# Patient Record
Sex: Female | Born: 1990 | Race: White | Hispanic: No | Marital: Single | Smoking: Never smoker
Health system: Southern US, Community
[De-identification: ages and names within clinical notes are randomized; demographics above are authoritative.]

## PROBLEM LIST (undated history)

## (undated) DIAGNOSIS — K589 Irritable bowel syndrome without diarrhea: Secondary | ICD-10-CM

## (undated) HISTORY — DX: Irritable bowel syndrome without diarrhea: K58.9

## (undated) HISTORY — PX: WISDOM TOOTH EXTRACTION: SHX21

---
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
## 2015-01-23 ENCOUNTER — Encounter: Payer: Self-pay | Admitting: Internal Medicine

## 2015-03-11 ENCOUNTER — Ambulatory Visit (INDEPENDENT_AMBULATORY_CARE_PROVIDER_SITE_OTHER): Payer: Federal, State, Local not specified - PPO | Admitting: Internal Medicine

## 2015-03-11 ENCOUNTER — Other Ambulatory Visit (INDEPENDENT_AMBULATORY_CARE_PROVIDER_SITE_OTHER): Payer: Federal, State, Local not specified - PPO

## 2015-03-11 ENCOUNTER — Encounter: Payer: Self-pay | Admitting: Internal Medicine

## 2015-03-11 VITALS — BP 110/70 | HR 80 | Ht 68.0 in | Wt 137.8 lb

## 2015-03-11 DIAGNOSIS — R1031 Right lower quadrant pain: Secondary | ICD-10-CM | POA: Diagnosis not present

## 2015-03-11 DIAGNOSIS — K5900 Constipation, unspecified: Secondary | ICD-10-CM | POA: Diagnosis not present

## 2015-03-11 LAB — CBC WITH DIFFERENTIAL/PLATELET
Basophils Absolute: 0 10*3/uL (ref 0.0–0.1)
Basophils Relative: 0.4 % (ref 0.0–3.0)
Eosinophils Absolute: 0 10*3/uL (ref 0.0–0.7)
Eosinophils Relative: 0.5 % (ref 0.0–5.0)
HCT: 43.3 % (ref 36.0–46.0)
Hemoglobin: 15 g/dL (ref 12.0–15.0)
LYMPHS ABS: 1.8 10*3/uL (ref 0.7–4.0)
LYMPHS PCT: 29.6 % (ref 12.0–46.0)
MCHC: 34.6 g/dL (ref 30.0–36.0)
MCV: 86.5 fl (ref 78.0–100.0)
MONOS PCT: 10.9 % (ref 3.0–12.0)
Monocytes Absolute: 0.7 10*3/uL (ref 0.1–1.0)
Neutro Abs: 3.5 10*3/uL (ref 1.4–7.7)
Neutrophils Relative %: 58.6 % (ref 43.0–77.0)
PLATELETS: 283 10*3/uL (ref 150.0–400.0)
RBC: 5 Mil/uL (ref 3.87–5.11)
RDW: 12.9 % (ref 11.5–15.5)
WBC: 6 10*3/uL (ref 4.0–10.5)

## 2015-03-11 LAB — COMPREHENSIVE METABOLIC PANEL
ALBUMIN: 4.6 g/dL (ref 3.5–5.2)
ALK PHOS: 54 U/L (ref 39–117)
ALT: 11 U/L (ref 0–35)
AST: 15 U/L (ref 0–37)
BUN: 13 mg/dL (ref 6–23)
CO2: 26 mEq/L (ref 19–32)
Calcium: 9.6 mg/dL (ref 8.4–10.5)
Chloride: 103 mEq/L (ref 96–112)
Creatinine, Ser: 0.73 mg/dL (ref 0.40–1.20)
GFR: 103.87 mL/min (ref >60.00)
Glucose, Bld: 99 mg/dL (ref 70–99)
POTASSIUM: 4.2 meq/L (ref 3.5–5.1)
SODIUM: 136 meq/L (ref 135–145)
Total Bilirubin: 0.6 mg/dL (ref 0.2–1.2)
Total Protein: 7.6 g/dL (ref 6.0–8.3)

## 2015-03-11 LAB — VITAMIN B12: Vitamin B-12: 167 pg/mL — ABNORMAL LOW (ref 211–911)

## 2015-03-11 LAB — TSH: TSH: 3.37 u[IU]/mL (ref 0.35–4.50)

## 2015-03-11 LAB — SEDIMENTATION RATE: SED RATE: 13 mm/h (ref 0–22)

## 2015-03-11 MED ORDER — LINACLOTIDE 145 MCG PO CAPS: 145.0000 ug | ORAL_CAPSULE | Freq: Every day | ORAL | Status: AC

## 2015-03-11 MED ORDER — HYOSCYAMINE SULFATE 0.125 MG SL SUBL: 0.1250 mg | SUBLINGUAL_TABLET | SUBLINGUAL | Status: AC | PRN

## 2015-03-11 NOTE — Progress Notes (Signed)
Kaitlyn Stewart [birth year] [id number]  Note: This dictation was prepared with Dragon digital system. Any transcriptional errors that result from this procedure are unintentional.   History of Present Illness: This is a 24 year old white female who comes with her mother to evaluate change in bowel habits. She was seen by pediatric gastroenterologist Dr. Chestine Spore about 10 years ago for diarrhea and urgency but no definite cause was found. Her sprue profile was negative and the diarrhea subsided. She is now complaining of mostly constipation and bloating. She'll wake up at night with crampy abdominal pain. She does not take any laxatives.and denies rectal bleeding. Her weight has recently increased 5 pounds since she moved in with her boyfriend. She reports  good eating habits. There is a positive family history of irritable bowel syndrome in her father and colon polyps in her mother. Patient has an office job. She goes to gym 3 times a week. Her current bowel habits are BM every 3 days. She has not taken any laxatives  except for a herbal tea .    No past medical history on file.  No past surgical history on file.  Allergies not on file  Family history and social history have been reviewed.  Review of Systems: Occasional rectal irritation. Lower abdominal pain. Predominant constipation with the breakthrough diarrhea  The remainder of the 10 point ROS is negative except as outlined in the H&P  Physical Exam: General Appearance Well developed, in no distress Eyes  Non icteric  HEENT  Non traumatic, normocephalic  Mouth No lesion, tongue papillated, no cheilosis Neck Supple without adenopathy, thyroid not enlarged, no carotid bruits, no JVD Lungs Clear to auscultation bilaterally COR Normal S1, normal S2, regular rhythm, no murmur, quiet precordium Abdomen soft mildly tender in the right lower and middle quadrants. No fullness. No rebound. Liver edge at costal margin. Left lower and upper  quadrants unremarkable Rectal normal rectal sphincter tone. Normal perianal area. Heart Hemoccult-negative stool Extremities  No pedal edema Skin No lesions Neurological Alert and oriented x 3 Psychological Normal mood and affect  Assessment and Plan:   24 year old white female with history of diarrhea attributed to irritable bowel syndrome by a pediatric gastroenterologist. She is now having predominant constipation and  breakthrough diarrhea occasionally. Otherwise she is eating well and has actually gained 5 pounds. There is no family history of inflammatory bowel disease. On her physical exam she has tenderness in RMQ a question due to  either constipation or possible Crohn's disease. Although irritable bowel syndrome is the most likely diagnosis  we will proceed with CT scan of the abdomen to visualize  terminal ileum and to rule out any organic disease. We will be also checking sedimentation rate, metabolic panel. TSH. Iron and B12. We will begin Linzess 145 g daily and Levsin sublingually 0.125 mg on when necessary basis for cramps. She will also begin Benefiber  1 heaping teaspoon every morning. I will see her in 8 weeks    Lina Sar 03/11/2015

## 2015-03-11 NOTE — Patient Instructions (Signed)
We have sent medications to your pharmacy for you to pick up at your convenience.  Your physician has requested that you go to the basement for lab work before leaving today.  You are schedule for a follow up with Dr Olevia Perches 04/29/2015 at 10:15am.  Please get Benefiber and take a heaping teaspoon daily.  You have been scheduled for a CT scan of the abdomen and pelvis at [place] are scheduled on 03/13/2015 at 930am. You should arrive 15 minutes prior to your appointment time for registration. Please follow the written instructions below on the day of your exam:  WARNING: IF YOU ARE ALLERGIC TO IODINE/X-RAY DYE, PLEASE NOTIFY RADIOLOGY IMMEDIATELY AT [phone]! YOU WILL BE GIVEN A 13 HOUR PREMEDICATION PREP.  1) Do not eat or drink anything after 530am (4 hours prior to your test) 2) You have been given 2 bottles of oral contrast to drink. The solution may taste better if refrigerated, but do NOT add ice or any other liquid to this solution. Shake well before drinking.    Drink 1 bottle of contrast @ 730am (2 hours prior to your exam)  Drink 1 bottle of contrast @ 830am (1 hour prior to your exam)  You may take any medications as prescribed with a small amount of water except for the following: Metformin, Glucophage, Glucovance, Avandamet, Riomet, Fortamet, Actoplus Met, Janumet, Glumetza or Metaglip. The above medications must be held the day of the exam AND 48 hours after the exam.  The purpose of you drinking the oral contrast is to aid in the visualization of your intestinal tract. The contrast solution may cause some diarrhea. Before your exam is started, you will be given a small amount of fluid to drink. Depending on your individual set of symptoms, you may also receive an intravenous injection of x-ray contrast/dye. Plan on being at [facility] for 30 minutes or long, depending on the type of exam you are having performed.  This test typically takes  30-45 minutes to complete.  If you have any questions regarding your exam or if you need to reschedule, you may call the CT department at [phone] between the hours of 8:00 am and 5:00 pm, Monday-Friday.  ________________________________________________________________________

## 2015-03-12 ENCOUNTER — Other Ambulatory Visit: Payer: Self-pay | Admitting: *Deleted

## 2015-03-12 DIAGNOSIS — E538 Deficiency of other specified B group vitamins: Secondary | ICD-10-CM

## 2015-03-12 MED ORDER — CYANOCOBALAMIN 1000 MCG/ML IJ SOLN: INTRAMUSCULAR | Status: AC

## 2015-03-13 ENCOUNTER — Other Ambulatory Visit: Payer: Self-pay

## 2015-03-13 ENCOUNTER — Ambulatory Visit
Admission: RE | Admit: 2015-03-13 | Discharge: 2015-03-13 | Disposition: A | Discharge status: Home / Self Care | Payer: Federal, State, Local not specified - PPO | Source: Ambulatory Visit | Attending: Internal Medicine | Admitting: Internal Medicine

## 2015-03-13 DIAGNOSIS — R1031 Right lower quadrant pain: Secondary | ICD-10-CM | POA: Insufficient documentation

## 2015-03-13 DIAGNOSIS — K561 Intussusception: Secondary | ICD-10-CM

## 2015-03-13 DIAGNOSIS — R109 Unspecified abdominal pain: Secondary | ICD-10-CM

## 2015-03-13 DIAGNOSIS — K5900 Constipation, unspecified: Secondary | ICD-10-CM | POA: Diagnosis present

## 2015-03-13 DIAGNOSIS — R935 Abnormal findings on diagnostic imaging of other abdominal regions, including retroperitoneum: Secondary | ICD-10-CM

## 2015-03-13 MED ORDER — IOHEXOL 300 MG/ML  SOLN
100.0000 mL | Freq: Once | INTRAMUSCULAR | Status: AC | PRN
  Administered 2015-03-13: 100 mL via INTRAVENOUS

## 2015-03-14 ENCOUNTER — Telehealth: Payer: Self-pay | Admitting: Internal Medicine

## 2015-03-14 NOTE — Telephone Encounter (Signed)
Spoke with mother extensively, wait for SBFT next week.

## 2015-03-19 ENCOUNTER — Ambulatory Visit
Admission: RE | Admit: 2015-03-19 | Discharge: 2015-03-19 | Disposition: A | Discharge status: Home / Self Care | Payer: Federal, State, Local not specified - PPO | Source: Ambulatory Visit | Attending: Internal Medicine | Admitting: Internal Medicine

## 2015-03-19 ENCOUNTER — Telehealth: Payer: Self-pay | Admitting: Internal Medicine

## 2015-03-19 DIAGNOSIS — R935 Abnormal findings on diagnostic imaging of other abdominal regions, including retroperitoneum: Secondary | ICD-10-CM | POA: Diagnosis not present

## 2015-03-19 DIAGNOSIS — R109 Unspecified abdominal pain: Secondary | ICD-10-CM

## 2015-03-19 DIAGNOSIS — K561 Intussusception: Secondary | ICD-10-CM

## 2015-03-19 MED ORDER — METHYLPREDNISOLONE 4 MG PO TBPK: ORAL_TABLET | ORAL | Status: AC

## 2015-03-19 NOTE — Telephone Encounter (Signed)
Patient's mother given recommendations. Per mother patient is having severe itching. Per Dr. Juanda ChanceBrodie, Medrol dose pack for itching. Referral made to Dr. Maisie Fushomas on 04/08/15 at 10:00/10:30.

## 2015-03-19 NOTE — Telephone Encounter (Signed)
Spoke with patient's mother who is calling to report a red, raised itchy rash on patient's chest, stomach and back. States the rash started yesterday. The patient took her first B12 injection last Thursday and Linzess for the first time on Friday. Patient has not taken another Linzess since Friday. She is going to take Benadryl po after her SBFT is done. Please, advise.

## 2015-03-19 NOTE — Telephone Encounter (Signed)
Patient's mother given appointment date and time. Rx sent.

## 2015-03-19 NOTE — Telephone Encounter (Signed)
Pt's mother called back and said Pt had a B12 injection last Thursday as well

## 2015-03-19 NOTE — Telephone Encounter (Signed)
Stop Linzess, continue B12. I have spoken with Dr Romie LeveeAlicia Thomas, surgeon  About a referral for consultation and she will be happy to see Kaitlyn FelixKatelyn although  her condition usually does not require surgery,although it could be the cause for   abdominal pain.

## 2015-03-20 ENCOUNTER — Ambulatory Visit: Payer: Self-pay | Admitting: Internal Medicine

## 2015-03-20 ENCOUNTER — Ambulatory Visit (HOSPITAL_COMMUNITY): Payer: Federal, State, Local not specified - PPO

## 2015-03-28 ENCOUNTER — Telehealth: Payer: Self-pay | Admitting: *Deleted

## 2015-03-28 NOTE — Telephone Encounter (Signed)
Spoke with patient's mother and she did not have any problems with her B12 injection this week. Patient's mother is calling to ask [facility] (Fax [phone] Please, advise if this is ok.

## 2015-03-28 NOTE — Telephone Encounter (Signed)
[facility] but not [facility] SB[facility].Marland Kitchen. Second opinion.requested my patients's mother.

## 2015-03-31 NOTE — Telephone Encounter (Signed)
Records have been faxed. [facility] will call patient with appointment.

## 2015-04-03 ENCOUNTER — Telehealth: Payer: Self-pay | Admitting: Internal Medicine

## 2015-04-29 ENCOUNTER — Ambulatory Visit: Payer: Federal, State, Local not specified - PPO | Admitting: Internal Medicine

## 2016-01-17 IMAGING — RF DG SMALL BOWEL
15 of 19 series · 15 of 19 positions shown · non-contrast
Comparison: CT abdomen 03/13/2015

CLINICAL DATA: Pt c/o RLQ pain/crapming x1 year, pain turned severe
x1 month ago and was accompanied by constipation, since then pt
suffers from diarrhea as well as constipation, pt noticed a
Palpatable lump in left pelvic region x4 days, no prev surg, no
chance of preg, NPO

EXAM:
SMALL BOWEL SERIES
TECHNIQUE: Following ingestion of thin barium, serial small bowel images were
obtained including spot views of the terminal ileum.
FLUOROSCOPY TIME:  Radiation Exposure Index (as provided by the
fluoroscopic device): 45.9 mGy

[Series 1: t abdomen supine · 0.15mm/px · 1 of 1 slices shown]
[im 1/1]
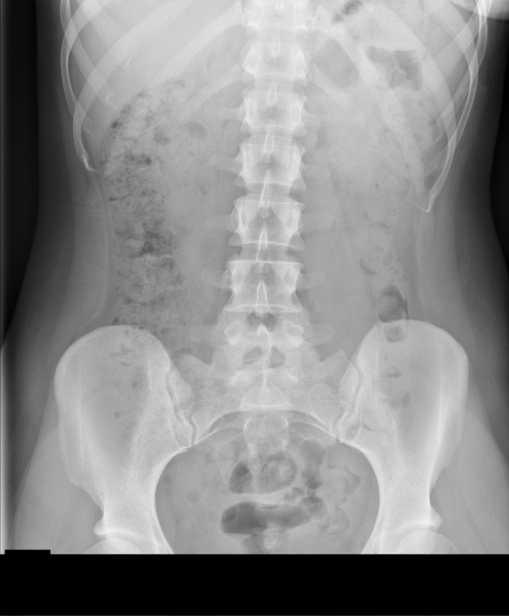

[Series 2: t abdomen barium ap · 0.15mm/px · 1 of 1 slices shown (1 of 6)]
[im 1/1]
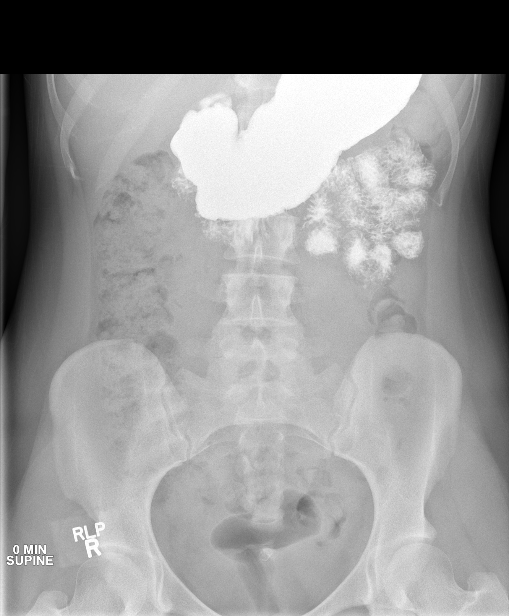

[Series 4: t abdomen barium ap · 0.15mm/px · 1 of 1 slices shown (2 of 6)]
[im 1/1]
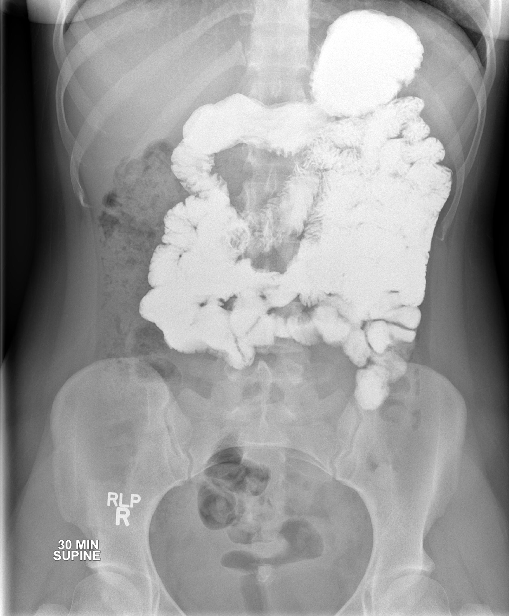

[Series 5: t abdomen barium ap · 0.15mm/px · 1 of 1 slices shown (3 of 6)]
[im 1/1]
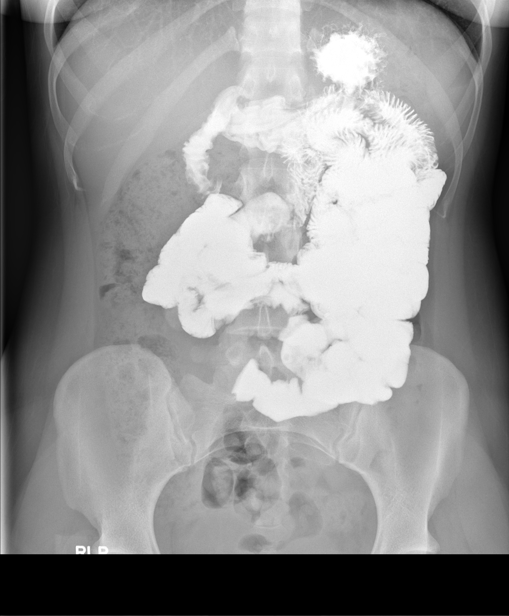

[Series 6: t abdomen barium ap · 0.15mm/px · 1 of 1 slices shown (4 of 6)]
[im 1/1]
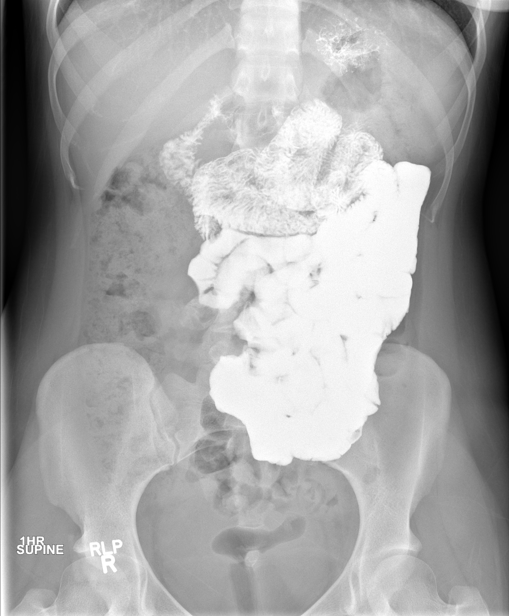

[Series 7: t abdomen barium ap · 0.15mm/px · 1 of 1 slices shown (5 of 6)]
[im 1/1]
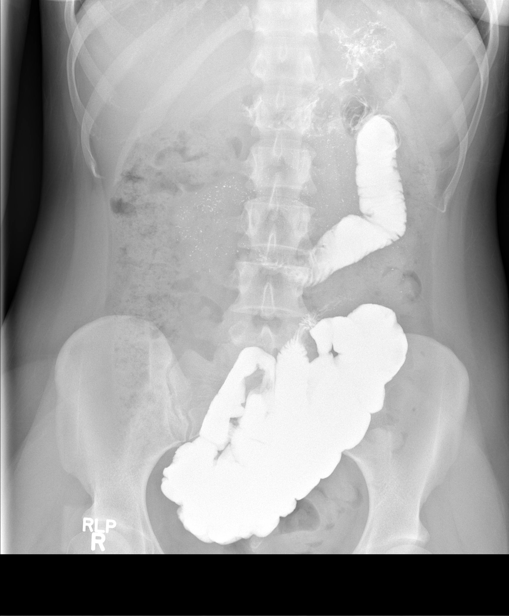

[Series 9: cp_standard · 0.29mm/px · 1 of 1 slices shown (1 of 8)]
[im 1/1]
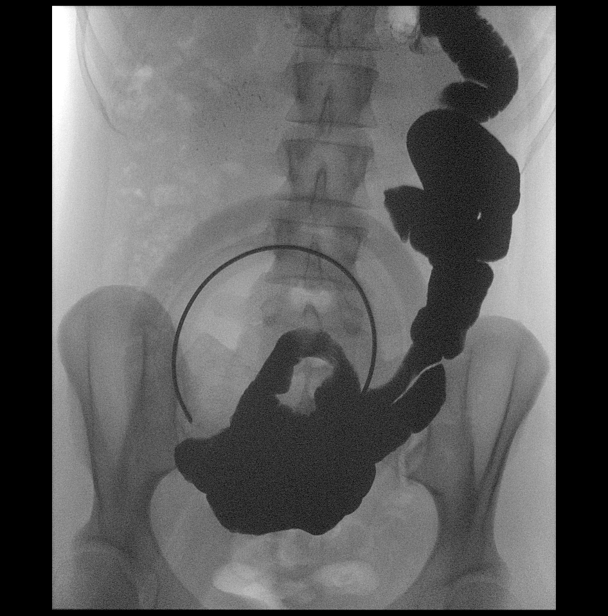

[Series 10: cp_standard · 0.29mm/px · 1 of 1 slices shown (2 of 8)]
[im 1/1]
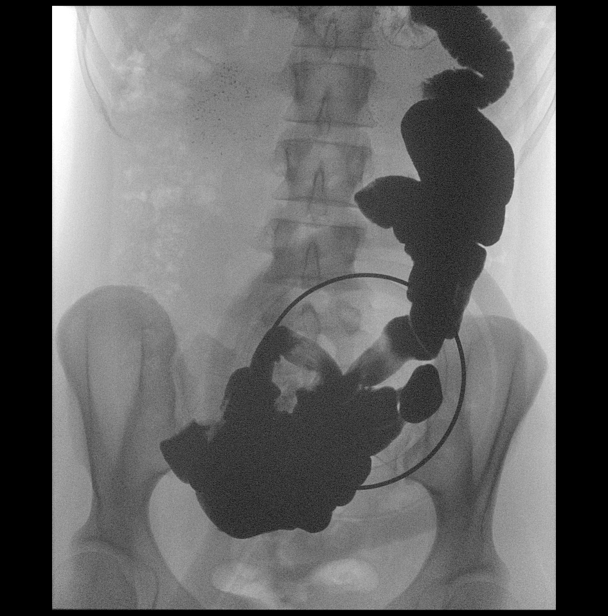

[Series 11: cp_standard · 0.29mm/px · 1 of 1 slices shown (3 of 8)]
[im 1/1]
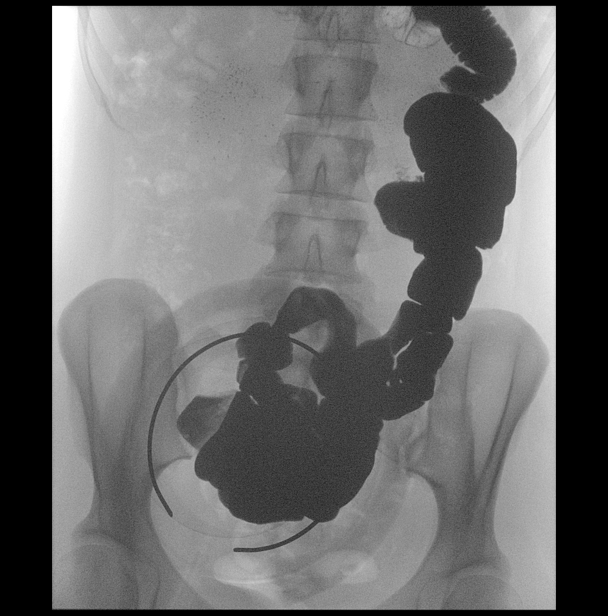

[Series 13: t abdomen barium ap · 0.15mm/px · 1 of 1 slices shown (6 of 6)]
[im 1/1]
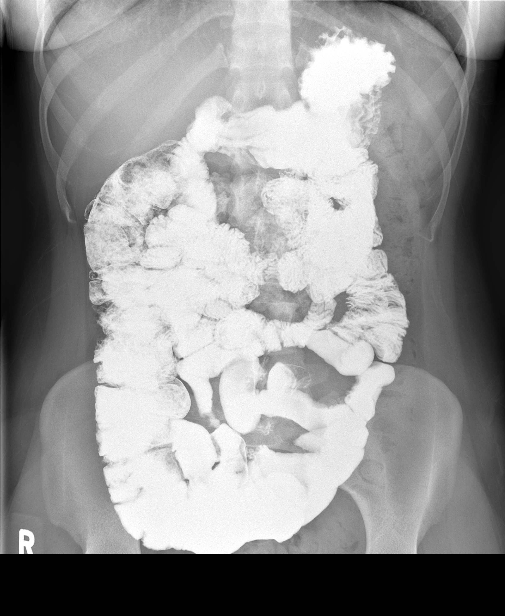

[Series 14: cp_standard · 0.28mm/px · 1 of 1 slices shown (4 of 8)]
[im 1/1]
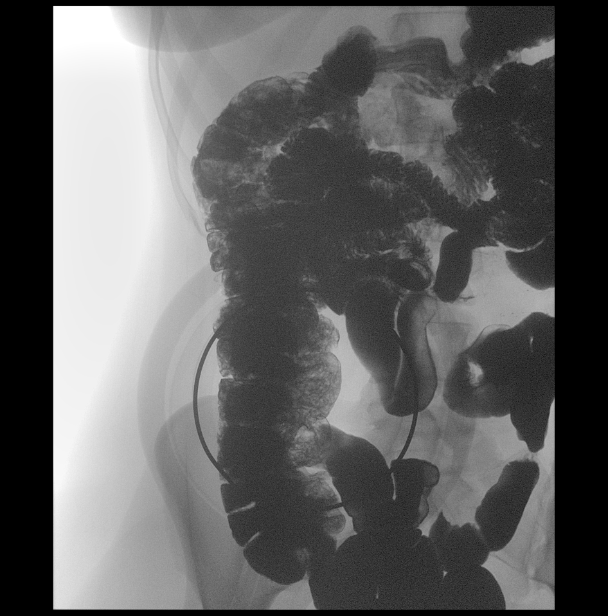

[Series 15: cp_standard · 0.28mm/px · 1 of 1 slices shown (5 of 8)]
[im 1/1]
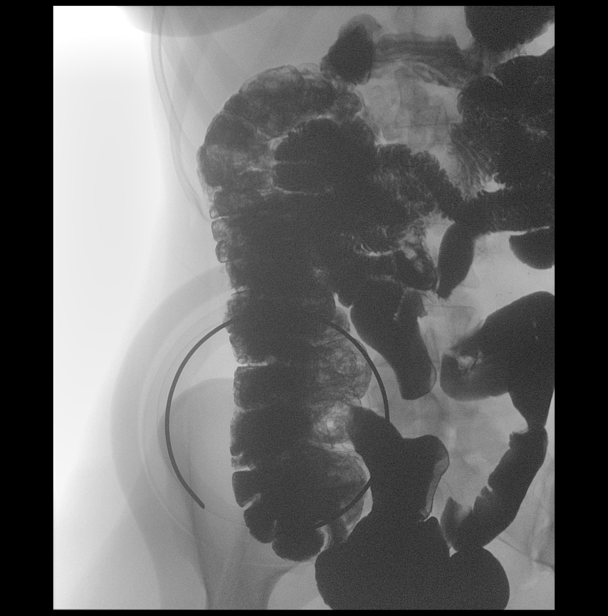

[Series 16: cp_standard · 0.28mm/px · 1 of 1 slices shown (6 of 8)]
[im 1/1]
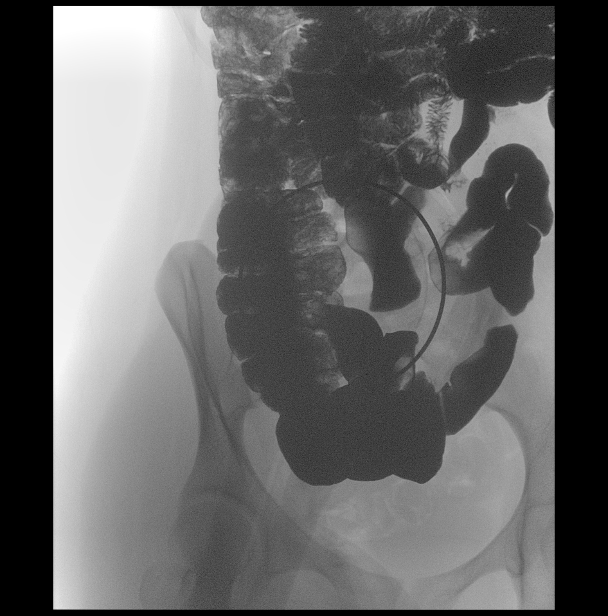

[Series 18: cp_standard · 0.28mm/px · 1 of 1 slices shown (7 of 8)]
[im 1/1]
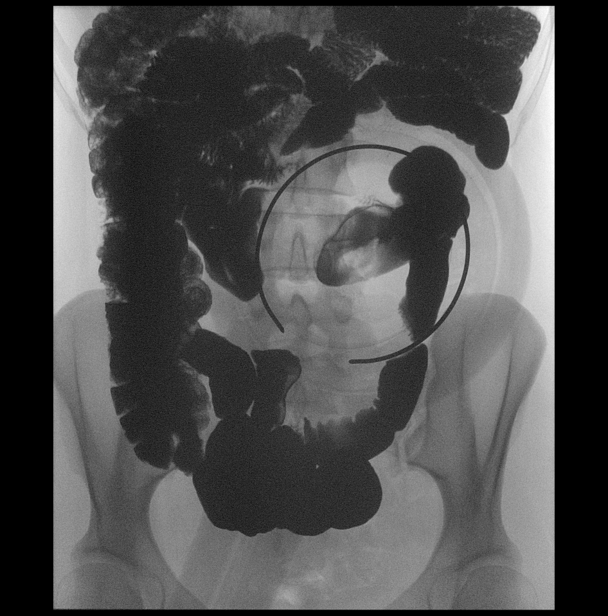

[Series 19: cp_standard · 0.28mm/px · 1 of 1 slices shown (8 of 8)]
[im 1/1]
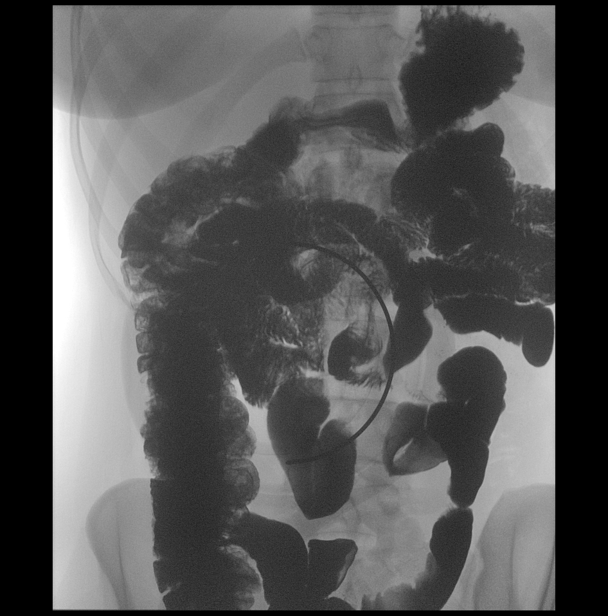

[15 of 19 positions shown; findings below may reference images not displayed]

FINDINGS: Scout frontal abdominal and pelvic radiograph demonstrates no bowel
dilatation. Moderate amount of stool in the colon..

Medium density barium was periodically observed under fluoroscopy to
travel from the stomach to the ascending colon (over a 90 minute
time period). There is no evidence of small bowel stricture or
obstruction. No large filling defects to suggest mass lesion. In
addition, there is no evidence of tethering or definite inflammatory
changes present within the small bowel.
IMPRESSION: 1. Normal small bowel follow-through. The previously seen areas of
short-segment intussusception involving the small bowel in the left
side of the abdomen on recent CT abdomen dated 03/13/2015 are not
present on the current exam.
